# Patient Record
Sex: Male | Born: 1985 | Race: White | Hispanic: No | Marital: Married | Smoking: Current every day smoker
Health system: Southern US, Community
[De-identification: ages and names within clinical notes are randomized; demographics above are authoritative.]

## PROBLEM LIST (undated history)

## (undated) DIAGNOSIS — I10 Essential (primary) hypertension: Secondary | ICD-10-CM

## (undated) DIAGNOSIS — G43909 Migraine, unspecified, not intractable, without status migrainosus: Secondary | ICD-10-CM

---

## 2016-09-26 ENCOUNTER — Emergency Department
Admission: EM | Admit: 2016-09-26 | Discharge: 2016-09-26 | Disposition: A | Discharge status: Home / Self Care | Payer: Self-pay | Attending: Emergency Medicine | Admitting: Emergency Medicine

## 2016-09-26 ENCOUNTER — Other Ambulatory Visit: Payer: Self-pay

## 2016-09-26 ENCOUNTER — Emergency Department: Payer: Self-pay

## 2016-09-26 ENCOUNTER — Encounter: Payer: Self-pay | Admitting: Emergency Medicine

## 2016-09-26 ENCOUNTER — Ambulatory Visit
Admission: EM | Admit: 2016-09-26 | Discharge: 2016-09-26 | Disposition: A | Discharge status: Other Acute Inpatient Hospital | Payer: Self-pay | Attending: Family Medicine | Admitting: Family Medicine

## 2016-09-26 DIAGNOSIS — R0789 Other chest pain: Secondary | ICD-10-CM

## 2016-09-26 DIAGNOSIS — R519 Headache, unspecified: Secondary | ICD-10-CM

## 2016-09-26 DIAGNOSIS — R079 Chest pain, unspecified: Secondary | ICD-10-CM | POA: Insufficient documentation

## 2016-09-26 DIAGNOSIS — F17210 Nicotine dependence, cigarettes, uncomplicated: Secondary | ICD-10-CM | POA: Insufficient documentation

## 2016-09-26 DIAGNOSIS — R51 Headache: Secondary | ICD-10-CM

## 2016-09-26 DIAGNOSIS — R05 Cough: Secondary | ICD-10-CM | POA: Insufficient documentation

## 2016-09-26 LAB — BASIC METABOLIC PANEL
ANION GAP: 7 (ref 5–15)
BUN: 12 mg/dL (ref 6–20)
CALCIUM: 9.3 mg/dL (ref 8.9–10.3)
CO2: 26 mmol/L (ref 22–32)
Chloride: 105 mmol/L (ref 101–111)
Creatinine, Ser: 0.84 mg/dL (ref 0.61–1.24)
GFR calc Af Amer: >60 mL/min (ref >60)
Glucose, Bld: 111 mg/dL — ABNORMAL HIGH (ref 65–99)
Potassium: 3.5 mmol/L (ref 3.5–5.1)
Sodium: 138 mmol/L (ref 135–145)

## 2016-09-26 LAB — CBC
HCT: 42.3 % (ref 40.0–52.0)
HEMOGLOBIN: 14.5 g/dL (ref 13.0–18.0)
MCH: 29.1 pg (ref 26.0–34.0)
MCHC: 34.3 g/dL (ref 32.0–36.0)
MCV: 84.8 fL (ref 80.0–100.0)
Platelets: 271 10*3/uL (ref 150–440)
RBC: 4.99 MIL/uL (ref 4.40–5.90)
RDW: 13.5 % (ref 11.5–14.5)
WBC: 11.8 10*3/uL — ABNORMAL HIGH (ref 3.8–10.6)

## 2016-09-26 LAB — TROPONIN I: Troponin I: <0.03 ng/mL (ref <0.03)

## 2016-09-26 LAB — FIBRIN DERIVATIVES D-DIMER (ARMC ONLY): Fibrin derivatives D-dimer (ARMC): 207.88 (ref 0.00–499.00)

## 2016-09-26 MED ORDER — ACETAMINOPHEN 500 MG PO TABS
1000.0000 mg | ORAL_TABLET | Freq: Once | ORAL | Status: AC
  Administered 2016-09-26: 1000 mg via ORAL
  Filled 2016-09-26: qty 2

## 2016-09-26 MED ORDER — PROCHLORPERAZINE EDISYLATE 5 MG/ML IJ SOLN
10.0000 mg | Freq: Once | INTRAMUSCULAR | Status: AC
  Administered 2016-09-26: 10 mg via INTRAVENOUS
  Filled 2016-09-26: qty 2

## 2016-09-26 MED ORDER — GI COCKTAIL ~~LOC~~
30.0000 mL | Freq: Once | ORAL | Status: AC
  Administered 2016-09-26: 30 mL via ORAL
  Filled 2016-09-26: qty 30

## 2016-09-26 MED ORDER — SODIUM CHLORIDE 0.9 % IV BOLUS (SEPSIS)
1000.0000 mL | Freq: Once | INTRAVENOUS | Status: AC
  Administered 2016-09-26: 1000 mL via INTRAVENOUS

## 2016-09-26 NOTE — ED Notes (Signed)
Blood drawn from IV lac.  Flushed with ns.  Pt in nad.  Lab process explained.

## 2016-09-26 NOTE — ED Provider Notes (Signed)
[facility] Emergency Department Provider Note  ____________________________________________   I have reviewed the triage vital signs and the nursing notes.   HISTORY  Chief Complaint Chest Pain   History limited by: Not Limited   HPI Devin Edwards is a 31 y.o. male who presents to the emergency department today via ambulance from urgent care because of concerns for chest pain. Patient states he has been having the chest pain for about 1 month. Has located in the central chest. He describes it as stabbing that goes to his back. This morning he had worse pain. He also felt like pain was going down his right arm. It is accompanied by some cough. The patient feels that he has had a hard time catching his breath. Has not noticed anything that makes the pain worse.    No past medical history on file.  There are no active problems to display for this patient.   No past surgical history on file.  Prior to Admission medications   Medication Sig Start Date End Date Taking? Authorizing Provider  butabarbital (BUTISOL) 30 MG tablet Take by mouth daily.    Historical Provider, MD    Allergies Aspirin  No family history on file.  Social History Social History  Substance Use Topics  . Smoking status: Current Every Day Smoker    Packs/day: 1.00    Types: Cigarettes  . Smokeless tobacco: Never Used  . Alcohol use No    Review of Systems  Constitutional: Negative for fever. Cardiovascular: Positive for chest pain. Respiratory: Positive for cough. Gastrointestinal: Negative for abdominal pain, vomiting and diarrhea. Genitourinary: Negative for dysuria. Musculoskeletal: Negative for back pain. Skin: Negative for rash. Neurological: Negative for headaches, focal weakness or numbness.  10-point ROS otherwise negative.  ____________________________________________   PHYSICAL EXAM:  VITAL SIGNS: ED Triage Vitals  Enc Vitals Group     BP  154/89     Pulse 75     Resp 23     Temp 98.6     Temp src      SpO2 97   Constitutional: Alert and oriented. Well appearing and in no distress. Eyes: Conjunctivae are normal. Normal extraocular movements. ENT   Head: Normocephalic and atraumatic.   Nose: No congestion/rhinnorhea.   Mouth/Throat: Mucous membranes are moist.   Neck: No stridor. Hematological/Lymphatic/Immunilogical: No cervical lymphadenopathy. Cardiovascular: Normal rate, regular rhythm.  No murmurs, rubs, or gallops.  Respiratory: Normal respiratory effort without tachypnea nor retractions. Breath sounds are clear and equal bilaterally. No wheezes/rales/rhonchi. Gastrointestinal: Soft and slightly tender to palpation somewhat diffusely, worse tenderness in epigastric region. Genitourinary: Deferred Musculoskeletal: Normal range of motion in all extremities. No lower extremity edema. Neurologic:  Normal speech and language. No gross focal neurologic deficits are appreciated.  Skin:  Skin is warm, dry and intact. No rash noted. Psychiatric: Mood and affect are normal. Speech and behavior are normal. Patient exhibits appropriate insight and judgment.  ____________________________________________    LABS (pertinent positives/negatives)  Labs Reviewed  BASIC METABOLIC PANEL - Abnormal; Notable for the following:       Result Value   Glucose, Bld 111 (*)    All other components within normal limits  CBC - Abnormal; Notable for the following:    WBC 11.8 (*)    All other components within normal limits  TROPONIN I     ____________________________________________   EKG  I, Phineas Semen, attending physician, personally viewed and interpreted this EKG  EKG Time: 1541 Rate: 74  Rhythm: normal sinus rhythm Axis: normal Intervals: qtc 423 QRS: narrow, q waves III, avf ST changes: no st elevation Impression: abnormal ekg   ____________________________________________     RADIOLOGY  CXR IMPRESSION:  No acute cardiopulmonary disease.      ____________________________________________   PROCEDURES  Procedures  ____________________________________________   INITIAL IMPRESSION / ASSESSMENT AND PLAN / ED COURSE  Pertinent labs & imaging results that were available during my care of the patient were reviewed by me and considered in my medical decision making (see chart for details).  Patient presented to the emergency department today because of concerns for chest pain that is been going on for roughly 1 month however was worse today. Patient did have some Q waves on the EKG. 2 sets of troponin however negative. Initially d-dimer within normal limits. Patient states that he does have primary care with [facility]. Discussed with patient importance of following up. This point however doubt ACS, blood clots, dissection, pneumothorax, pneumonia  ____________________________________________   FINAL CLINICAL IMPRESSION(S) / ED DIAGNOSES  Final diagnoses:  Nonspecific chest pain     Note: This dictation was prepared with Dragon dictation. Any transcriptional errors that result from this process are unintentional     Phineas SemenGraydon Rylinn Linzy, MD 09/26/16 1935
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
## 2016-09-26 NOTE — ED Notes (Signed)
Patient transported to X-ray 

## 2016-09-26 NOTE — Discharge Instructions (Signed)
Please seek medical attention for any high fevers, chest pain, shortness of breath, change in behavior, persistent vomiting, bloody stool or any other new or concerning symptoms.  

## 2016-09-26 NOTE — ED Triage Notes (Signed)
[facility]; sent over due to chest pain and abnormal EKG.  Pt reports intermittent chest pain with radiation through to back x one month, pt also reports intermittent dizziness and headache x approximately 3 months.  Pt A/Ox4, hypertensive upon arrival, NAD noted at this time.

## 2016-09-26 NOTE — ED Notes (Signed)
EMS called to transport patient to [facility] ED 

## 2016-09-26 NOTE — ED Triage Notes (Signed)
Patient c/o chest pain and left arm pain that started 10 min ago while at work.  Patient reports SOB.

## 2017-03-11 ENCOUNTER — Ambulatory Visit
Admission: EM | Admit: 2017-03-11 | Discharge: 2017-03-11 | Disposition: A | Discharge status: Home / Self Care | Payer: Self-pay | Attending: Family Medicine | Admitting: Family Medicine

## 2017-03-11 ENCOUNTER — Ambulatory Visit (INDEPENDENT_AMBULATORY_CARE_PROVIDER_SITE_OTHER): Payer: Self-pay

## 2017-03-11 ENCOUNTER — Encounter: Payer: Self-pay | Admitting: *Deleted

## 2017-03-11 DIAGNOSIS — S20319A Abrasion of unspecified front wall of thorax, initial encounter: Secondary | ICD-10-CM

## 2017-03-11 DIAGNOSIS — S20211A Contusion of right front wall of thorax, initial encounter: Secondary | ICD-10-CM

## 2017-03-11 DIAGNOSIS — W319XXA Contact with unspecified machinery, initial encounter: Secondary | ICD-10-CM

## 2017-03-11 DIAGNOSIS — Z23 Encounter for immunization: Secondary | ICD-10-CM

## 2017-03-11 HISTORY — DX: Migraine, unspecified, not intractable, without status migrainosus: G43.909

## 2017-03-11 HISTORY — DX: Essential (primary) hypertension: I10

## 2017-03-11 MED ORDER — NAPROXEN 500 MG PO TABS: 500.0000 mg | ORAL_TABLET | Freq: Two times a day (BID) | ORAL | 0 refills | Status: AC

## 2017-03-11 MED ORDER — TETANUS-DIPHTH-ACELL PERTUSSIS 5-2.5-18.5 LF-MCG/0.5 IM SUSP
0.5000 mL | Freq: Once | INTRAMUSCULAR | Status: AC
  Administered 2017-03-11: 0.5 mL via INTRAMUSCULAR

## 2017-03-11 MED ORDER — MUPIROCIN 2 % EX OINT: 1.0000 "application " | TOPICAL_OINTMENT | Freq: Three times a day (TID) | CUTANEOUS | 0 refills | Status: AC

## 2017-03-11 NOTE — ED Triage Notes (Signed)
Patient injured his mid chest when a cut off saw jumped back on him creating an abrasion.

## 2017-03-11 NOTE — Discharge Instructions (Signed)
Use ice on your chest 20 minutes out of every 2 hours 4-5 times daily for pain. Apply Bactroban ointment to the abrasion 3 times daily.

## 2017-03-11 NOTE — ED Provider Notes (Signed)
[facility]    CSN: [id number] Arrival date & time: 03/11/17  1101     History   Chief Complaint Chief Complaint  Patient presents with  . Abrasion    HPI Marcello Richad Ramsay is a 31 y.o. male.   HPI  This a 31 year old Hispanic male who was cutting a large pipe with a 14 inch carbide chop saw when the saw became bound and kicked back striking him hard in his anterior chest over his right breast. The foreman who accompanies him did not see the incident but he was told that the saw had largely stopped spinning but hit his chest with a fair amount of force. Patient did not have his breath knocked out of him but has sustained a abrasion over his right breast. The wound was cleaned thoroughly at work with alcohol and scrubbing with soap and water. The patient is not current on his DTaP.       Past Medical History:  Diagnosis Date  . Hypertension   . Migraine     There are no active problems to display for this patient.   History reviewed. No pertinent surgical history.     Home Medications    Prior to Admission medications   Medication Sig Start Date End Date Taking? Authorizing Provider  butabarbital (BUTISOL) 30 MG tablet Take by mouth daily.   Yes [provider]  LISINOPRIL PO Take by mouth.   Yes [provider]  mupirocin ointment (BACTROBAN) 2 % Apply 1 application topically 3 (three) times daily. 03/11/17   Lutricia Feil, PA-C  naproxen (NAPROSYN) 500 MG tablet Take 1 tablet (500 mg total) by mouth 2 (two) times daily. 03/11/17   Lutricia Feil, PA-C    Family History Family History  Problem Relation Age of Onset  . Hypertension Mother   . Healthy Father     Social History Social History  Substance Use Topics  . Smoking status: Current Every Day Smoker    Packs/day: 1.00    Types: Cigarettes  . Smokeless tobacco: Never Used  . Alcohol use No     Allergies   Aspirin   Review of Systems Review of  Systems  Constitutional: Positive for activity change. Negative for chills, fatigue and fever.  Skin: Positive for color change and wound.  All other systems reviewed and are negative.    Physical Exam Triage Vital Signs ED Triage Vitals  Enc Vitals Group     BP 03/11/17 1140 (!) 144/87     Pulse Rate 03/11/17 1140 62     Resp 03/11/17 1140 16     Temp 03/11/17 1140 98.8 F (37.1 C)     Temp Source 03/11/17 1140 Oral     SpO2 03/11/17 1140 100 %     Weight 03/11/17 1141 195 lb (88.5 kg)     Height 03/11/17 1141 5\' 9"  (1.753 m)     Head Circumference --      Peak Flow --      Pain Score 03/11/17 1143 3     Pain Loc --      Pain Edu? --      Excl. in GC? --    No data found.   Updated Vital Signs BP (!) 144/87 (BP Location: Left Arm)   Pulse 62   Temp 98.8 F (37.1 C) (Oral)   Resp 16   Ht 5\' 9"  (1.753 m)   Wt 195 lb (88.5 kg)   SpO2 100%  BMI 28.80 kg/m   Visual Acuity Right Eye Distance:   Left Eye Distance:   Bilateral Distance:    Right Eye Near:   Left Eye Near:    Bilateral Near:     Physical Exam  Constitutional: He is oriented to person, place, and time. He appears well-developed and well-nourished. No distress.  HENT:  Head: Normocephalic and atraumatic.  Eyes: Pupils are equal, round, and reactive to light. Right eye exhibits no discharge. Left eye exhibits no discharge.  Neck: Normal range of motion. Neck supple.  Cardiovascular: Normal rate, regular rhythm and normal heart sounds.  Exam reveals no gallop and no friction rub.   No murmur heard. Pulmonary/Chest: Effort normal and breath sounds normal. No respiratory distress. He has no wheezes. He has no rales. He exhibits tenderness.  Musculoskeletal: Normal range of motion. He exhibits tenderness.  There is tenderness surrounding the abraded area. There is no crepitus or deformity appreciated. There is a small bruise noticed in the abraded area.  Neurological: He is alert and oriented to  person, place, and time.  Skin: Skin is warm and dry. He is not diaphoretic.  Patient has an abrasion of his right breast. There is a bruise in the middle of the abraded area.  Psychiatric: He has a normal mood and affect. His behavior is normal. Judgment and thought content normal.  Nursing note and vitals reviewed.    UC Treatments / Results  Labs (all labs ordered are listed, but only abnormal results are displayed) Labs Reviewed - No data to display  EKG  EKG Interpretation None       Radiology Dg Chest 2 View  Result Date: 03/11/2017 CLINICAL DATA:  Chest laceration on the right due to a saw blade EXAM: CHEST  2 VIEW COMPARISON:  PA and lateral chest x-ray of September 26, 2016 FINDINGS: The lungs are reasonably well inflated and clear. The heart is top-normal in size. The pulmonary vascularity is normal. The mediastinum is normal in width. There is no pleural effusion, pneumothorax, or pneumomediastinum. The retrosternal soft tissues appear normal. The bony structures are unremarkable. IMPRESSION: There is no evidence of acute thoracic injury nor other acute cardiopulmonary abnormality. Electronically Signed   By: David  [place] M.D.   On: 03/11/2017 12:29    Procedures Procedures (including critical care time)  Medications Ordered in UC Medications  Tdap (BOOSTRIX) injection 0.5 mL (0.5 mLs Intramuscular Given 03/11/17 1227)     Initial Impression / Assessment and Plan / UC Course  I have reviewed the triage vital signs and the nursing notes.  Pertinent labs & imaging results that were available during my care of the patient were reviewed by me and considered in my medical decision making (see chart for details).     Plan: 1. Test/x-ray results and diagnosis reviewed with patient 2. rx as per orders; risks, benefits, potential side effects reviewed with patient 3. Recommend supportive treatment with ice  20 minutes out of every 2 hours 4-5 times daily to help with pain.  Apply Bactroban ointment to the chest abrasion 3 times daily until healed. Take Naprosyn as prescribed for pain. Patient states he is able to take NSAIDs without difficulty even though has an allergy to aspirin.If He has any problems or is not progressing well he should return to clinic or go to emergency department. 4. F/u prn if symptoms worsen or don't improve   Final Clinical Impressions(s) / UC Diagnoses   Final diagnoses:  Abrasion of chest wall,  initial encounter  Contusion, chest wall, right, initial encounter    New Prescriptions Discharge Medication List as of 03/11/2017  1:00 PM    START taking these medications   Details  mupirocin ointment (BACTROBAN) 2 % Apply 1 application topically 3 (three) times daily., Starting Mon 03/11/2017, Normal    naproxen (NAPROSYN) 500 MG tablet Take 1 tablet (500 mg total) by mouth 2 (two) times daily., Starting Mon 03/11/2017, Normal         Controlled Substance Prescriptions [place] Controlled Substance Registry consulted? Not Applicable   Lutricia Feil, PA-C 03/11/17 1308

## 2019-03-10 ENCOUNTER — Other Ambulatory Visit: Payer: Self-pay

## 2019-03-10 DIAGNOSIS — Z20822 Contact with and (suspected) exposure to covid-19: Secondary | ICD-10-CM

## 2019-03-11 ENCOUNTER — Telehealth: Payer: Self-pay | Admitting: General Practice

## 2019-03-11 LAB — NOVEL CORONAVIRUS, NAA: SARS-CoV-2, NAA: NOT DETECTED

## 2019-03-11 NOTE — Telephone Encounter (Signed)
Patient called in and received covid test results

## 2019-04-23 ENCOUNTER — Other Ambulatory Visit: Payer: Self-pay

## 2019-04-23 DIAGNOSIS — Z20822 Contact with and (suspected) exposure to covid-19: Secondary | ICD-10-CM

## 2019-04-24 LAB — NOVEL CORONAVIRUS, NAA: SARS-CoV-2, NAA: NOT DETECTED

## 2019-04-27 ENCOUNTER — Telehealth: Payer: Self-pay | Admitting: General Practice

## 2019-04-27 NOTE — Telephone Encounter (Signed)
Negative COVID results given. Patient results "NOT Detected." Caller expressed understanding. ° °

## 2019-05-27 IMAGING — CR DG CHEST 2V
2 series · 2 of 2 positions shown · non-contrast
Comparison: PA and lateral chest x-ray September 26, 2016

CLINICAL DATA: Chest laceration on the right due to a saw blade

EXAM:
CHEST  2 VIEW

[chest pa]
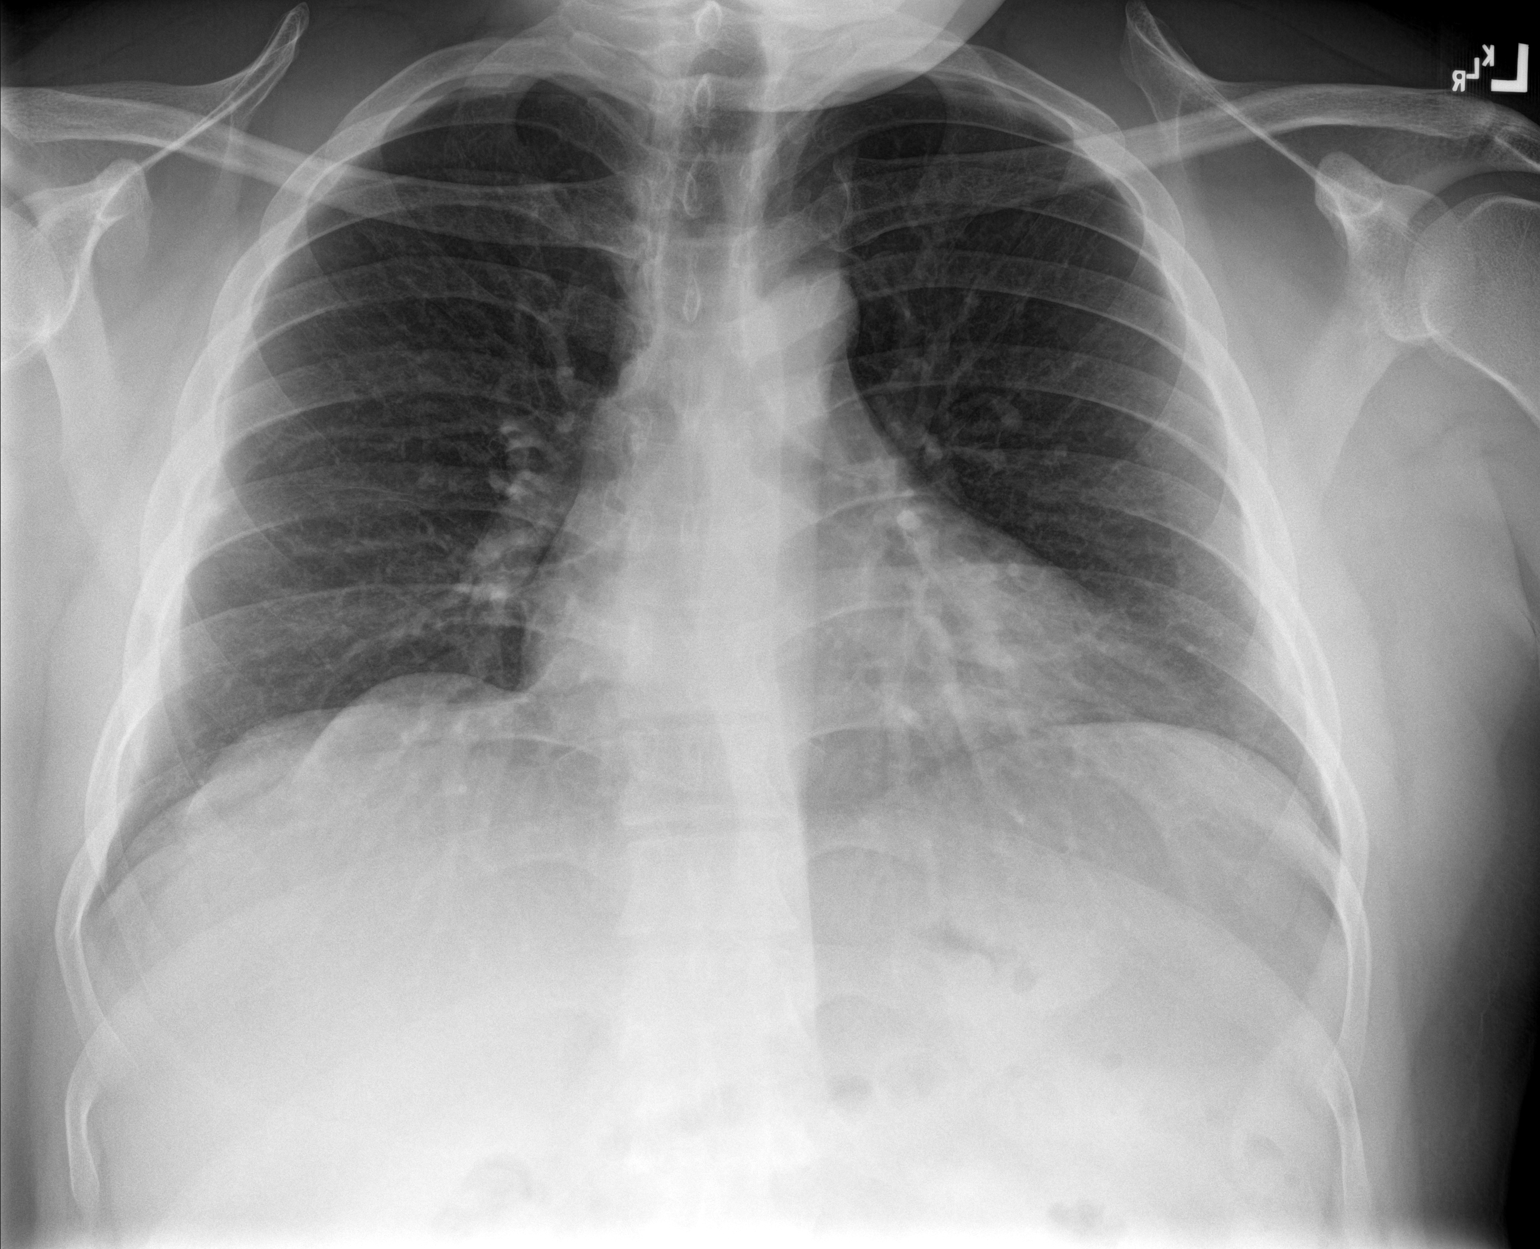

[chest lat]
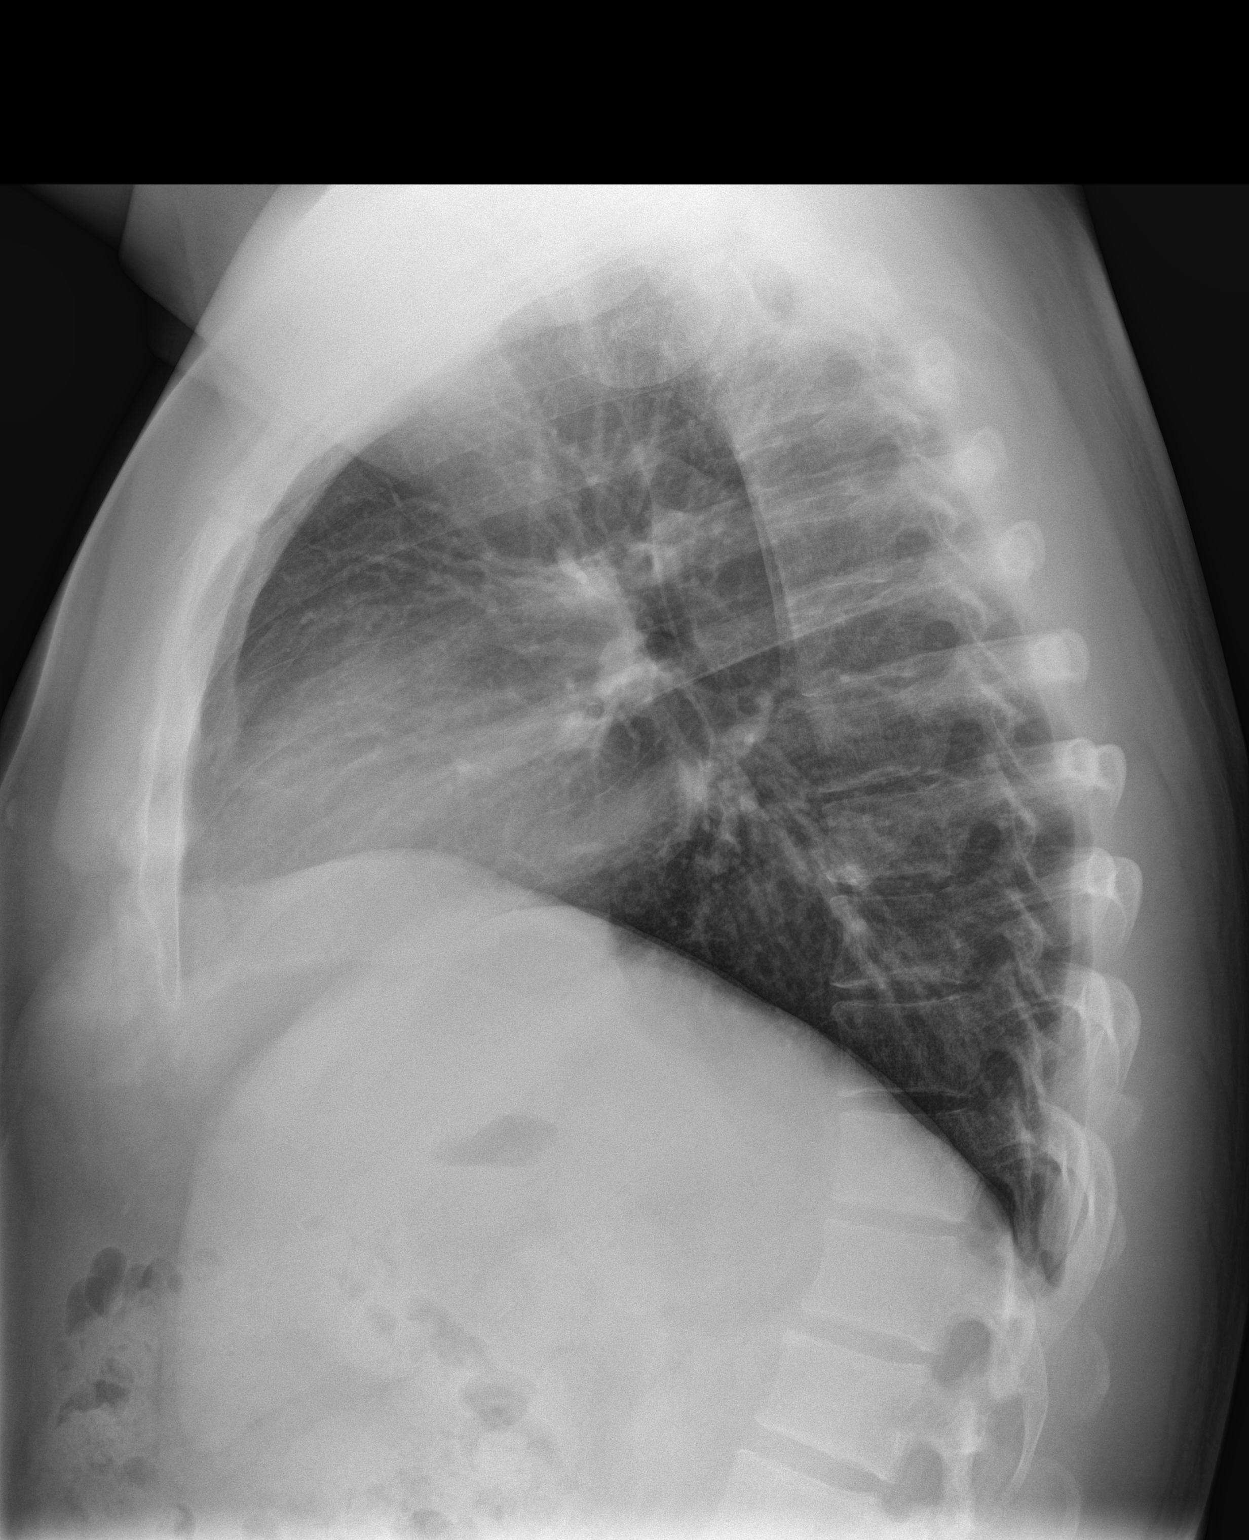

[2 of 2 positions shown; findings below may reference images not displayed]

FINDINGS: The lungs are reasonably well inflated and clear. The heart is
top-normal in size. The pulmonary vascularity is normal. The
mediastinum is normal in width. There is no pleural effusion,
pneumothorax, or pneumomediastinum. The retrosternal soft tissues
appear normal. The bony structures are unremarkable.
IMPRESSION: There is no evidence of acute thoracic injury nor other acute
cardiopulmonary abnormality.
